# Patient Record
Sex: Male | Born: 1990 | Race: Black or African American | Hispanic: No | Marital: Single | Smoking: Never smoker
Health system: Southern US, Community
[De-identification: ages and names within clinical notes are randomized; demographics above are authoritative.]

## PROBLEM LIST (undated history)

## (undated) DIAGNOSIS — J302 Other seasonal allergic rhinitis: Secondary | ICD-10-CM

---
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
## 2009-01-20 ENCOUNTER — Emergency Department (HOSPITAL_COMMUNITY): Admission: EM | Admit: 2009-01-20 | Discharge: 2009-01-20 | Payer: Self-pay | Admitting: Emergency Medicine

## 2009-06-04 ENCOUNTER — Emergency Department (HOSPITAL_COMMUNITY): Admission: EM | Admit: 2009-06-04 | Discharge: 2009-06-04 | Payer: Self-pay | Admitting: Emergency Medicine

## 2009-06-07 ENCOUNTER — Emergency Department (HOSPITAL_COMMUNITY): Admission: EM | Admit: 2009-06-07 | Discharge: 2009-06-07 | Payer: Self-pay | Admitting: Emergency Medicine

## 2010-10-16 ENCOUNTER — Emergency Department (HOSPITAL_COMMUNITY)
Admission: EM | Admit: 2010-10-16 | Discharge: 2010-10-16 | Disposition: A | Discharge status: Home / Self Care | Payer: Private Health Insurance - Indemnity | Attending: Emergency Medicine | Admitting: Emergency Medicine

## 2010-10-16 DIAGNOSIS — IMO0002 Reserved for concepts with insufficient information to code with codable children: Secondary | ICD-10-CM | POA: Insufficient documentation
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
## 2012-05-29 ENCOUNTER — Encounter (HOSPITAL_COMMUNITY): Payer: Self-pay | Admitting: Emergency Medicine

## 2012-05-29 ENCOUNTER — Emergency Department (HOSPITAL_COMMUNITY)
Admission: EM | Admit: 2012-05-29 | Discharge: 2012-05-29 | Disposition: A | Discharge status: Home / Self Care | Payer: Private Health Insurance - Indemnity | Attending: Emergency Medicine | Admitting: Emergency Medicine

## 2012-05-29 DIAGNOSIS — K0889 Other specified disorders of teeth and supporting structures: Secondary | ICD-10-CM

## 2012-05-29 DIAGNOSIS — F17200 Nicotine dependence, unspecified, uncomplicated: Secondary | ICD-10-CM | POA: Insufficient documentation

## 2012-05-29 DIAGNOSIS — K089 Disorder of teeth and supporting structures, unspecified: Secondary | ICD-10-CM | POA: Insufficient documentation

## 2012-05-29 MED ORDER — NAPROXEN 250 MG PO TABS
500.0000 mg | ORAL_TABLET | Freq: Once | ORAL | Status: AC
  Administered 2012-05-29: 500 mg via ORAL
  Filled 2012-05-29: qty 2
  Filled 2012-05-29: qty 1

## 2012-05-29 MED ORDER — NAPROXEN 500 MG PO TABS: 500.0000 mg | ORAL_TABLET | Freq: Two times a day (BID) | ORAL | Status: DC

## 2012-05-29 NOTE — ED Notes (Signed)
Patient complaining of dental pain since yesterday. States he took a Tylenol PM at 0100 today with no relief.

## 2012-05-29 NOTE — ED Provider Notes (Signed)
History     CSN: [id number]  Arrival date & time 05/29/12  8295   First MD Initiated Contact with Patient 05/29/12 0400      Chief Complaint  Patient presents with  . Dental Pain    (Consider location/radiation/quality/duration/timing/severity/associated sxs/prior treatment) HPI  Brandon Ayala is a 21 y.o. male who presents to the Emergency Department complaining of pain to both wisdom teeth on the bottom right and left that has been present since yesterday.. Pain is with chewing. He has bitten the inside of his cheek multiple times in the past with his wisdom teeth. He has been seen by a dentist in the past who advised him to have the teeth extracted. He has taken no medicine.   History reviewed. No pertinent past medical history.  History reviewed. No pertinent past surgical history.  History reviewed. No pertinent family history.  History  Substance Use Topics  . Smoking status: Current Every Day Smoker  . Smokeless tobacco: Not on file  . Alcohol Use: No      Review of Systems  Constitutional: Negative for fever.       10 Systems reviewed and are negative for acute change except as noted in the HPI.  HENT: Positive for dental problem. Negative for congestion.   Eyes: Negative for discharge and redness.  Respiratory: Negative for cough and shortness of breath.   Cardiovascular: Negative for chest pain.  Gastrointestinal: Negative for vomiting and abdominal pain.  Musculoskeletal: Negative for back pain.  Skin: Negative for rash.  Neurological: Negative for syncope, numbness and headaches.  Psychiatric/Behavioral:       No behavior change.    Allergies  Review of patient's allergies indicates no known allergies.  Home Medications  No current outpatient prescriptions on file.  BP 154/79  Pulse 64  Temp 98.4 F (36.9 C) (Oral)  Resp 16  Ht 5\' 11"  (1.803 m)  Wt 225 lb (102.059 kg)  BMI 31.38 kg/m2  SpO2 99%  Physical Exam  Nursing note and  vitals reviewed. Constitutional:       Awake, alert, nontoxic appearance.  HENT:  Head: Normocephalic and atraumatic.  Mouth/Throat: Oropharynx is clear and moist.       Dentition in good repair. Molars without evidence of caries or abscess. Gums are not swollen. No buccal injuries noted.  Eyes: Right eye exhibits no discharge. Left eye exhibits no discharge.  Neck: Neck supple.  Cardiovascular: Normal heart sounds.   Pulmonary/Chest: Effort normal and breath sounds normal. He exhibits no tenderness.  Abdominal: Soft. There is no tenderness. There is no rebound.  Musculoskeletal: He exhibits no tenderness.       Baseline ROM, no obvious new focal weakness.  Neurological:       Mental status and motor strength appears baseline for patient and situation.  Skin: No rash noted.  Psychiatric: He has a normal mood and affect.    ED Course  Procedures (including critical care time)     MDM  Patient with dental pain. Given naprosyn. Rx for same. Encouraged to follow up with a dentist.Pt stable in ED with no significant deterioration in condition.The patient appears reasonably screened and/or stabilized for discharge and I doubt any other medical condition or other [facility] requiring further screening, evaluation, or treatment in the ED at this time prior to discharge.  MDM Reviewed: nursing note and vitals           Nicoletta Dress. Colon Branch, MD 05/29/12 [phone]

## 2012-06-26 ENCOUNTER — Encounter (HOSPITAL_COMMUNITY): Payer: Self-pay

## 2012-06-26 ENCOUNTER — Emergency Department (HOSPITAL_COMMUNITY)
Admission: EM | Admit: 2012-06-26 | Discharge: 2012-06-27 | Disposition: A | Discharge status: Home / Self Care | Payer: Private Health Insurance - Indemnity | Attending: Emergency Medicine | Admitting: Emergency Medicine

## 2012-06-26 DIAGNOSIS — F17200 Nicotine dependence, unspecified, uncomplicated: Secondary | ICD-10-CM | POA: Insufficient documentation

## 2012-06-26 DIAGNOSIS — K047 Periapical abscess without sinus: Secondary | ICD-10-CM | POA: Insufficient documentation

## 2012-06-26 MED ORDER — AMOXICILLIN 250 MG PO CAPS
500.0000 mg | ORAL_CAPSULE | Freq: Once | ORAL | Status: AC
  Administered 2012-06-26: 500 mg via ORAL
  Filled 2012-06-26: qty 2

## 2012-06-26 MED ORDER — HYDROCODONE-ACETAMINOPHEN 7.5-500 MG/15ML PO SOLN: 15.0000 mL | Freq: Four times a day (QID) | ORAL | Status: DC | PRN

## 2012-06-26 MED ORDER — OXYCODONE-ACETAMINOPHEN 5-325 MG PO TABS
2.0000 | ORAL_TABLET | Freq: Once | ORAL | Status: AC
  Administered 2012-06-26: 2 via ORAL
  Filled 2012-06-26: qty 2

## 2012-06-26 MED ORDER — IBUPROFEN 800 MG PO TABS: 800.0000 mg | ORAL_TABLET | Freq: Three times a day (TID) | ORAL | Status: DC

## 2012-06-26 MED ORDER — AMOXICILLIN 500 MG PO CAPS: 500.0000 mg | ORAL_CAPSULE | Freq: Three times a day (TID) | ORAL | Status: DC

## 2012-06-26 NOTE — ED Provider Notes (Signed)
History    This chart was scribed for Brandon Nielsen, MD, MD by Smitty Pluck, ED Scribe. The patient was seen in room APA05 and the patient's care was started at 11:18PM.   CSN: [id number]  Arrival date & time 06/26/12  2220      Chief Complaint  Patient presents with  . Dental Pain    (Consider location/radiation/quality/duration/timing/severity/associated sxs/prior treatment) Patient is a 21 y.o. male presenting with tooth pain. The history is provided by the patient. No language interpreter was used.  Dental PainThe symptoms began 2 days ago. The symptoms are worsening. The symptoms are recurrent. The symptoms occur constantly.  Additional symptoms do not include: trouble swallowing, pain with swallowing and ear pain.   Brandon Ayala is a 21 y.o. male who presents to the Emergency Department complaining of constant, severe sharp left lower molar dental pain onset 2 days ago. Pt reports that he has taken naproxyn and advil without relief. Denies fevers, vomiting, trouble breathing, trouble swallowing and any other pain. Denies any other medical complication.   History reviewed. No pertinent past medical history.  History reviewed. No pertinent past surgical history.  No family history on file.  History  Substance Use Topics  . Smoking status: Current Every Day Smoker  . Smokeless tobacco: Not on file  . Alcohol Use: No      Review of Systems  HENT: Negative for ear pain and trouble swallowing.   All other systems reviewed and are negative.    Allergies  Review of patient's allergies indicates no known allergies.  Home Medications   Current Outpatient Rx  Name  Route  Sig  Dispense  Refill  . HYDROCODONE-ACETAMINOPHEN 5-325 MG PO TABS   Oral   Take 1 tablet by mouth once as needed. For pain         . NAPROXEN 500 MG PO TABS   Oral   Take 1 tablet (500 mg total) by mouth 2 (two) times daily.   30 tablet   0     BP 173/89  Pulse 70  Temp 98.3 F  (36.8 C) (Oral)  Resp 20  Ht 5\' 11"  (1.803 m)  Wt 225 lb (102.059 kg)  BMI 31.38 kg/m2  SpO2 96%  Physical Exam  Nursing note and vitals reviewed. Constitutional: He is oriented to person, place, and time. He appears well-developed and well-nourished. No distress.  HENT:  Head: Normocephalic and atraumatic.  Right Ear: External ear normal.  Left Ear: External ear normal.       tender to palpation lower first molar No gingival swelling or fluctuance  No trismus Uvula midline   Eyes: Conjunctivae normal are normal.  Neck: Normal range of motion. Neck supple. No tracheal deviation present.  Cardiovascular: Normal rate.   Pulmonary/Chest: Effort normal. No respiratory distress.  Abdominal: Soft. He exhibits no distension.  Musculoskeletal: Normal range of motion.  Lymphadenopathy:    He has no cervical adenopathy.  Neurological: He is alert and oriented to person, place, and time.  Skin: Skin is warm and dry.  Psychiatric: He has a normal mood and affect. His behavior is normal.    ED Course  Dental Date/Time: 06/26/2012 11:49 PM Performed by: Brandon Ayala Authorized by: Brandon Ayala Consent: Verbal consent obtained. Risks and benefits: risks, benefits and alternatives were discussed Consent given by: patient Patient understanding: patient states understanding of the procedure being performed Patient consent: the patient's understanding of the procedure matches consent given Procedure consent: procedure consent matches procedure  scheduled Required items: required blood products, implants, devices, and special equipment available Patient identity confirmed: verbally with patient Time out: Immediately prior to procedure a "time out" was called to verify the correct patient, procedure, equipment, support staff and site/side marked as required. Preparation: Patient was prepped and draped in the usual sterile fashion. Local anesthesia used: yes Local anesthetic: bupivacaine  0.5% without epinephrine Anesthetic total: 1.8 ml Patient tolerance: Patient tolerated the procedure well with no immediate complications. Comments: 27 G needle used to locally inject L lower first molar with adequate anesthesia achieved   (including critical care time) DIAGNOSTIC STUDIES: Oxygen Saturation is 96% on room air, adequate by my interpretation.    COORDINATION OF CARE: 11:20 PM Discussed ED treatment with pt  11:47 PM Ordered:    . amoxicillin  500 mg Oral Once  . oxyCODONE-acetaminophen  2 tablet Oral Once       MDM  Dental pain. Dental block. Medications provided. Plan d/c home, ABx, pain meds and DDS referral provided for f/u  VS and nursing notes reviewed.   I personally performed the services described in this documentation, which was scribed in my presence. The recorded information has been reviewed and is accurate.        Brandon Nielsen, MD 06/26/12 2351

## 2012-06-26 NOTE — ED Notes (Signed)
Pt c/o dental pain for 2 months that has gotten worse over the past 2 days. States he is unable to find a Education officer, community.

## 2012-06-26 NOTE — ED Notes (Signed)
Pt c/o left lower wisdom tooth pain x 2 days without relief. Pt denies having a dentist at this time. Minimal swelling noted to left jaw, and gum line. NAD noted at this time.

## 2015-01-16 ENCOUNTER — Encounter (HOSPITAL_COMMUNITY): Payer: Self-pay | Admitting: Emergency Medicine

## 2015-01-16 ENCOUNTER — Emergency Department (HOSPITAL_COMMUNITY)
Admission: EM | Admit: 2015-01-16 | Discharge: 2015-01-16 | Disposition: A | Discharge status: Home / Self Care | Payer: Private Health Insurance - Indemnity | Attending: Emergency Medicine | Admitting: Emergency Medicine

## 2015-01-16 DIAGNOSIS — R1084 Generalized abdominal pain: Secondary | ICD-10-CM

## 2015-01-16 DIAGNOSIS — R1111 Vomiting without nausea: Secondary | ICD-10-CM

## 2015-01-16 DIAGNOSIS — R42 Dizziness and giddiness: Secondary | ICD-10-CM | POA: Insufficient documentation

## 2015-01-16 DIAGNOSIS — R6883 Chills (without fever): Secondary | ICD-10-CM | POA: Insufficient documentation

## 2015-01-16 DIAGNOSIS — R112 Nausea with vomiting, unspecified: Secondary | ICD-10-CM | POA: Insufficient documentation

## 2015-01-16 DIAGNOSIS — Z720 Tobacco use: Secondary | ICD-10-CM | POA: Insufficient documentation

## 2015-01-16 LAB — URINALYSIS, ROUTINE W REFLEX MICROSCOPIC
BILIRUBIN URINE: NEGATIVE
Glucose, UA: NEGATIVE mg/dL
KETONES UR: NEGATIVE mg/dL
Leukocytes, UA: NEGATIVE
NITRITE: NEGATIVE
Protein, ur: NEGATIVE mg/dL
Specific Gravity, Urine: 1.02 (ref 1.005–1.030)
Urobilinogen, UA: 0.2 mg/dL (ref 0.0–1.0)
pH: 6.5 (ref 5.0–8.0)

## 2015-01-16 LAB — COMPREHENSIVE METABOLIC PANEL
ALT: 14 U/L — ABNORMAL LOW (ref 17–63)
AST: 15 U/L (ref 15–41)
Albumin: 4.8 g/dL (ref 3.5–5.0)
Alkaline Phosphatase: 61 U/L (ref 38–126)
Anion gap: 8 (ref 5–15)
BUN: 12 mg/dL (ref 6–20)
CALCIUM: 9 mg/dL (ref 8.9–10.3)
CO2: 26 mmol/L (ref 22–32)
Chloride: 105 mmol/L (ref 101–111)
Creatinine, Ser: 0.95 mg/dL (ref 0.61–1.24)
GFR calc Af Amer: >60 mL/min (ref >60)
GFR calc non Af Amer: >60 mL/min (ref >60)
Glucose, Bld: 102 mg/dL — ABNORMAL HIGH (ref 65–99)
Potassium: 4 mmol/L (ref 3.5–5.1)
SODIUM: 139 mmol/L (ref 135–145)
TOTAL PROTEIN: 8.3 g/dL — AB (ref 6.5–8.1)
Total Bilirubin: 0.5 mg/dL (ref 0.3–1.2)

## 2015-01-16 LAB — CBC WITH DIFFERENTIAL/PLATELET
BASOS ABS: 0 10*3/uL (ref 0.0–0.1)
Basophils Relative: 0 % (ref 0–1)
EOS ABS: 0 10*3/uL (ref 0.0–0.7)
Eosinophils Relative: 0 % (ref 0–5)
HEMATOCRIT: 45.9 % (ref 39.0–52.0)
HEMOGLOBIN: 15 g/dL (ref 13.0–17.0)
LYMPHS ABS: 1.2 10*3/uL (ref 0.7–4.0)
Lymphocytes Relative: 8 % — ABNORMAL LOW (ref 12–46)
MCH: 28.4 pg (ref 26.0–34.0)
MCHC: 32.7 g/dL (ref 30.0–36.0)
MCV: 86.8 fL (ref 78.0–100.0)
MONOS PCT: 10 % (ref 3–12)
Monocytes Absolute: 1.4 10*3/uL — ABNORMAL HIGH (ref 0.1–1.0)
Neutro Abs: 11.7 10*3/uL — ABNORMAL HIGH (ref 1.7–7.7)
Neutrophils Relative %: 82 % — ABNORMAL HIGH (ref 43–77)
PLATELETS: 272 10*3/uL (ref 150–400)
RBC: 5.29 MIL/uL (ref 4.22–5.81)
RDW: 14.6 % (ref 11.5–15.5)
WBC: 14.3 10*3/uL — AB (ref 4.0–10.5)

## 2015-01-16 LAB — URINE MICROSCOPIC-ADD ON

## 2015-01-16 LAB — LIPASE, BLOOD: Lipase: 15 U/L — ABNORMAL LOW (ref 22–51)

## 2015-01-16 MED ORDER — RANITIDINE HCL 150 MG PO TABS: 150.0000 mg | ORAL_TABLET | Freq: Two times a day (BID) | ORAL | Status: AC

## 2015-01-16 MED ORDER — FAMOTIDINE IN NACL 20-0.9 MG/50ML-% IV SOLN
20.0000 mg | INTRAVENOUS | Status: AC
  Administered 2015-01-16: 20 mg via INTRAVENOUS
  Filled 2015-01-16: qty 50

## 2015-01-16 NOTE — Discharge Instructions (Signed)
[facility] Doctor List ° ° ° °Edward Hawkins MD. Specialty: Pulmonary Disease Contact information: [address]  °[address]  °[place] [address]  °[phone]  ° °Margaret Simpson, MD. Specialty: Family Medicine Contact information: [address]  °[place] [address]  °[phone]  ° °Scott Luking, MD. Specialty: Family Medicine Contact information: [address]  °[address]  °[place] [address]  °[phone]  ° °Tesfaye Fanta, MD Specialty: Internal Medicine Contact information: [address]  °[place] [address]  °[phone]  ° °Zach Hall, MD. Specialty: Internal Medicine Contact information: [address]  °[place] [address]  °[phone]  ° °Angus Mcinnis, MD. Specialty: Family Medicine Contact information: [address]  °[place] [address]  °[phone]  ° °Stephen Knowlton, MD. Specialty: Family Medicine Contact information: [address]  °[address]  °[place] [address]  °[phone]  ° °Roy Fagan, MD. Specialty: Internal Medicine Contact information: [address]  °[address]  °[place] [address]  °[phone]  ° ° °

## 2015-01-16 NOTE — ED Notes (Signed)
Patient c/o generalized abd pain with nausea and vomiting. Denies any diarrhea or fevers. Per patient started this morning-vomited x3. Patient states that he took medication "for upset stomach" but unsure of name. No active vomiting noted at this time.

## 2015-01-16 NOTE — ED Provider Notes (Signed)
CSN: [id number]     Arrival date & time 01/16/15  1056 History  This chart was scribed for Eber Hong, MD by Tanda Rockers, ED Scribe. This patient was seen in room APA09/APA09 and the patient's care was started at 11:24 AM.  Chief Complaint  Patient presents with  . Abdominal Pain   The history is provided by the patient. No language interpreter was used.     HPI Comments: Brandon Ayala is a 24 y.o. male who presents to the Emergency Department complaining of generalized abdominal pain, nausea, and vomiting that began this morning around 9:30 AM (2 hours ago). Pt felt fine upon waking up but reports belching and having a sulfur taste in his mouth. Pt states that he went to work and was lifting boxes when his mouth filled with saliva. He states that he knew he was going to vomit and went to the bathroom to do so. Pt has vomited 3 times this morning. He describes the vomit as brown in color. Pt also got chilly prior to vomiting and states he became dizzy afterwards. Pt began having abdominal pain after vomiting but is currently pain free. He mentions that he has had this issue in the past approximately 3-4 months ago. This is the 3rd time it has happened since. Pt reports that sometimes he wakes up and belches, which he states smells like "old eggs." When this happens he reports he knows he will vomit that day. The vomiting always occurs in the morning. Pt has not seen a physician for these symptoms in the past. He denies eating anything or doing any activity that is out of the norm for him. Pt did not eat this morning but mentions eating pork chops and swiss rolls last night. Denies fever, diarrhea, constipation, headache, chest pain, shortness of breath, hematochezia, hematemesis, dysuria, hematuria, leg swelling, cough, shortness of breath, or any other symptoms. No recent foreign travel or past abdominal surgeries.  Pt denies EtOH or cigarette smoker. He mentions that he used to be an  occasional EtOH drinker but does not do so anymore. Pt reports smoking marijuana approximately 2 times per week.   History reviewed. No pertinent past medical history. History reviewed. No pertinent past surgical history. History reviewed. No pertinent family history. History  Substance Use Topics  . Smoking status: Current Every Day Smoker  . Smokeless tobacco: Not on file  . Alcohol Use: No    Review of Systems  Constitutional: Positive for chills. Negative for fever.  Respiratory: Negative for cough and shortness of breath.   Cardiovascular: Negative for chest pain and leg swelling.  Gastrointestinal: Positive for nausea, vomiting and abdominal pain. Negative for diarrhea and blood in stool.  Genitourinary: Negative for dysuria and hematuria.  Neurological: Positive for dizziness. Negative for headaches.  All other systems reviewed and are negative.  Allergies  Review of patient's allergies indicates no known allergies.  Home Medications   Prior to Admission medications   Medication Sig Start Date End Date Taking? Authorizing Provider  ranitidine (ZANTAC) 150 MG tablet Take 1 tablet (150 mg total) by mouth 2 (two) times daily. 01/16/15   Eber Hong, MD   Triage Vitals: BP 143/66 mmHg  Pulse 62  Temp(Src) 98.7 F (37.1 C) (Oral)  Resp 18  Ht 6' (1.829 m)  Wt 220 lb (99.791 kg)  BMI 29.83 kg/m2  SpO2 100%   Physical Exam  Constitutional: He appears well-developed and well-nourished. No distress.  HENT:  Head: Normocephalic and atraumatic.  Mouth/Throat: Oropharynx is clear and moist. No oropharyngeal exudate.  Eyes: Conjunctivae and EOM are normal. Pupils are equal, round, and reactive to light. Right eye exhibits no discharge. Left eye exhibits no discharge. No scleral icterus.  Neck: Normal range of motion. Neck supple. No JVD present. No thyromegaly present.  Cardiovascular: Normal rate, regular rhythm, normal heart sounds and intact distal pulses.  Exam reveals no  gallop and no friction rub.   No murmur heard. Pulmonary/Chest: Effort normal and breath sounds normal. No respiratory distress. He has no wheezes. He has no rales.  Abdominal: Soft. Bowel sounds are normal. He exhibits no distension and no mass. There is no tenderness.  Musculoskeletal: Normal range of motion. He exhibits no edema or tenderness.  Lymphadenopathy:    He has no cervical adenopathy.  Neurological: He is alert. Coordination normal.  Skin: Skin is warm and dry. No rash noted. No erythema.  Psychiatric: He has a normal mood and affect. His behavior is normal.  Nursing note and vitals reviewed.   ED Course  Procedures (including critical care time)  DIAGNOSTIC STUDIES: Oxygen Saturation is 100% on RA, normal by my interpretation.    COORDINATION OF CARE: 11:34 AM-Discussed treatment plan with pt at bedside and pt agreed to plan.   Labs Review Labs Reviewed  URINALYSIS, ROUTINE W REFLEX MICROSCOPIC (NOT AT [facility]) - Abnormal; Notable for the following:    Hgb urine dipstick LARGE (*)    All other components within normal limits  CBC WITH DIFFERENTIAL/PLATELET - Abnormal; Notable for the following:    WBC 14.3 (*)    Neutrophils Relative % 82 (*)    Neutro Abs 11.7 (*)    Lymphocytes Relative 8 (*)    Monocytes Absolute 1.4 (*)    All other components within normal limits  COMPREHENSIVE METABOLIC PANEL - Abnormal; Notable for the following:    Glucose, Bld 102 (*)    Total Protein 8.3 (*)    ALT 14 (*)    All other components within normal limits  LIPASE, BLOOD - Abnormal; Notable for the following:    Lipase 15 (*)    All other components within normal limits  URINE MICROSCOPIC-ADD ON - Abnormal; Notable for the following:    Bacteria, UA FEW (*)    All other components within normal limits    Imaging Review No results found.    MDM   Final diagnoses:  Non-intractable vomiting without nausea, vomiting of unspecified type  Generalized abdominal pain     The patient has normal lab work, he does have a leukocytosis but this is nonspecific and in the absence of any abdominal tenderness does not mandate further workup at this time. We'll start Zantac to be taken twice daily and then to transition to once daily after 10 days, patient is in agreement, follow-up list given for local family doctors. Patient expresses understanding and appears stable for discharge.  Meds given in ED:  Medications  famotidine (PEPCID) IVPB 20 mg premix (20 mg Intravenous New Bag/Given 01/16/15 1204)    New Prescriptions   RANITIDINE (ZANTAC) 150 MG TABLET    Take 1 tablet (150 mg total) by mouth 2 (two) times daily.     I personally performed the services described in this documentation, which was scribed in my presence. The recorded information has been reviewed and is accurate.      Eber Hong, MD 01/16/15 [phone]

## 2015-05-09 ENCOUNTER — Encounter (HOSPITAL_COMMUNITY): Payer: Self-pay | Admitting: Emergency Medicine

## 2015-05-09 ENCOUNTER — Emergency Department (HOSPITAL_COMMUNITY)
Admission: EM | Admit: 2015-05-09 | Discharge: 2015-05-09 | Disposition: A | Discharge status: Home / Self Care | Payer: Private Health Insurance - Indemnity | Attending: Emergency Medicine | Admitting: Emergency Medicine

## 2015-05-09 DIAGNOSIS — K529 Noninfective gastroenteritis and colitis, unspecified: Secondary | ICD-10-CM | POA: Insufficient documentation

## 2015-05-09 DIAGNOSIS — R6883 Chills (without fever): Secondary | ICD-10-CM | POA: Insufficient documentation

## 2015-05-09 HISTORY — DX: Other seasonal allergic rhinitis: J30.2

## 2015-05-09 LAB — URINALYSIS, ROUTINE W REFLEX MICROSCOPIC
Bilirubin Urine: NEGATIVE
Glucose, UA: NEGATIVE mg/dL
Ketones, ur: NEGATIVE mg/dL
LEUKOCYTES UA: NEGATIVE
Nitrite: NEGATIVE
PROTEIN: NEGATIVE mg/dL
UROBILINOGEN UA: 0.2 mg/dL (ref 0.0–1.0)
pH: 5.5 (ref 5.0–8.0)

## 2015-05-09 LAB — URINE MICROSCOPIC-ADD ON

## 2015-05-09 MED ORDER — ONDANSETRON 8 MG PO TBDP
8.0000 mg | ORAL_TABLET | Freq: Once | ORAL | Status: AC
  Administered 2015-05-09: 8 mg via ORAL
  Filled 2015-05-09: qty 1

## 2015-05-09 MED ORDER — ONDANSETRON 8 MG PO TBDP: 8.0000 mg | ORAL_TABLET | Freq: Three times a day (TID) | ORAL | Status: AC | PRN

## 2015-05-09 NOTE — ED Provider Notes (Signed)
CSN: [id number]     Arrival date & time 05/09/15  1308 History   First MD Initiated Contact with Patient 05/09/15 0800     Chief Complaint  Patient presents with  . Emesis     (Consider location/radiation/quality/duration/timing/severity/associated sxs/prior Treatment) The history is provided by the patient.   Brandon Ayala is a 24 y.o. male with no significant past medical history presenting with nausea vomiting and diarrhea.  He reports having diarrhea only starting 2 days ago, reporting several episodes of watery, non bloody stools which better so was able to work yesterday.  On his way to work this morning he had an episode of vomiting followed by diarrhea once again.  He denies abdominal pain except for cramping prior diarrheal episodes.  He denies weakness , dizziness or fevers.  He does endorse chills but when he took his temperature it was normal.  He has had no medicines prior to arrival.  He did take a cipro tablet yesterday given by his mother (who has chronic colitis) and had a left over tablet.    Past Medical History  Diagnosis Date  . Seasonal allergies    History reviewed. No pertinent past surgical history. History reviewed. No pertinent family history. Social History  Substance Use Topics  . Smoking status: Never Smoker   . Smokeless tobacco: Never Used  . Alcohol Use: No    Review of Systems  Constitutional: Positive for chills. Negative for fever.  HENT: Negative for congestion and sore throat.   Eyes: Negative.   Respiratory: Negative for chest tightness and shortness of breath.   Cardiovascular: Negative for chest pain.  Gastrointestinal: Positive for nausea and vomiting. Negative for abdominal pain and blood in stool.  Genitourinary: Negative.   Musculoskeletal: Negative for joint swelling, arthralgias and neck pain.  Skin: Negative.  Negative for rash and wound.  Neurological: Negative for dizziness, weakness, light-headedness, numbness and  headaches.  Psychiatric/Behavioral: Negative.       Allergies  Review of patient's allergies indicates no known allergies.  Home Medications   Prior to Admission medications   Medication Sig Start Date End Date Taking? Authorizing Provider  ondansetron (ZOFRAN ODT) 8 MG disintegrating tablet Take 1 tablet (8 mg total) by mouth every 8 (eight) hours as needed for nausea or vomiting. 05/09/15   Burgess Amor, PA-C  ranitidine (ZANTAC) 150 MG tablet Take 1 tablet (150 mg total) by mouth 2 (two) times daily. Patient not taking: Reported on 05/09/2015 01/16/15   Eber Hong, MD   BP 154/82 mmHg  Pulse 60  Temp(Src) 97.8 F (36.6 C) (Oral)  Resp 18  Ht 6' (1.829 m)  Wt 218 lb (98.884 kg)  BMI 29.56 kg/m2  SpO2 100% Physical Exam  Constitutional: He appears well-developed and well-nourished.  HENT:  Head: Normocephalic and atraumatic.  Mouth/Throat: Oropharynx is clear and moist.  Eyes: Conjunctivae are normal.  Neck: Normal range of motion.  Cardiovascular: Normal rate, regular rhythm, normal heart sounds and intact distal pulses.   Pulmonary/Chest: Effort normal and breath sounds normal. He has no wheezes.  Abdominal: Soft. Bowel sounds are normal. He exhibits no distension and no mass. There is no tenderness. There is no rebound and no guarding.  Musculoskeletal: Normal range of motion.  Neurological: He is alert.  Skin: Skin is warm and dry.  Psychiatric: He has a normal mood and affect.  Nursing note and vitals reviewed.   ED Course  Procedures (including critical care time) Labs Review Labs Reviewed  URINALYSIS,  ROUTINE W REFLEX MICROSCOPIC (NOT AT [facility]) - Abnormal; Notable for the following:    Specific Gravity, [facility] <1.005 (*)    Hgb urine dipstick SMALL (*)    All other components within normal limits  URINE MICROSCOPIC-ADD ON    Imaging Review No results found. I have personally reviewed and evaluated these images and lab results as part of my medical  decision-making.   EKG Interpretation None      MDM   Final diagnoses:  Gastroenteritis    Patients  labs reviewed.  No dehydration per UA.  Pt re-examined prior to dc home, abdomen soft, nontender.  Suspect patient has viral GI illness, mild.  No acute findings to suggest need for imaging or further labs.  He was given zofran which eliminated his nausea and tolerated PO fluids.  Given prescription for same, fluids, rest, BRAT diet.  Strict return instructions for development of pain or more frequent diarrhea or uncontrolled vomiting.  The patient appears reasonably screened and/or stabilized for discharge and I doubt any other medical condition or other [facility] requiring further screening, evaluation, or treatment in the ED at this time prior to discharge.    Burgess AmorJulie Clearance Chenault, PA-C 05/09/15 1634  Benjiman CoreNathan Pickering, MD 05/14/15 [phone]

## 2015-05-09 NOTE — ED Notes (Signed)
Per patient vomited once this morning and has had some diarrhea. Denies any fevers or abd pain. Patient states "This started a couple of days ago but I thought it was going away until I vomited again this morning." Denies any nausea at this time.

## 2015-05-09 NOTE — Discharge Instructions (Signed)
I recommend applying the B.r.a.t. Diet which can help relieve diarrhea and stomach upset (bananas, plain rice, applesauce, toast).  Also make sure you are drinking plenty of fluids.

## 2017-06-16 ENCOUNTER — Encounter (HOSPITAL_COMMUNITY): Payer: Self-pay | Admitting: Cardiology

## 2017-06-16 ENCOUNTER — Emergency Department (HOSPITAL_COMMUNITY)
Admission: EM | Admit: 2017-06-16 | Discharge: 2017-06-16 | Disposition: A | Discharge status: Home / Self Care | Payer: Private Health Insurance - Indemnity | Attending: Emergency Medicine | Admitting: Emergency Medicine

## 2017-06-16 DIAGNOSIS — L02412 Cutaneous abscess of left axilla: Secondary | ICD-10-CM

## 2017-06-16 MED ORDER — SULFAMETHOXAZOLE-TRIMETHOPRIM 800-160 MG PO TABS: 2.0000 | ORAL_TABLET | Freq: Two times a day (BID) | ORAL | 0 refills | Status: AC

## 2017-06-16 MED ORDER — HYDROCODONE-ACETAMINOPHEN 5-325 MG PO TABS: 1.0000 | ORAL_TABLET | ORAL | 0 refills | Status: AC | PRN

## 2017-06-16 MED ORDER — IBUPROFEN 600 MG PO TABS: 600.0000 mg | ORAL_TABLET | Freq: Four times a day (QID) | ORAL | 0 refills | Status: AC | PRN

## 2017-06-16 MED ORDER — HYDROMORPHONE HCL 1 MG/ML IJ SOLN
1.0000 mg | Freq: Once | INTRAMUSCULAR | Status: AC
  Administered 2017-06-16: 1 mg via INTRAMUSCULAR
  Filled 2017-06-16: qty 1

## 2017-06-16 MED ORDER — LIDOCAINE HCL (PF) 1 % IJ SOLN
5.0000 mL | Freq: Once | INTRAMUSCULAR | Status: AC
  Administered 2017-06-16: 5 mL
  Filled 2017-06-16: qty 6

## 2017-06-16 MED ORDER — POVIDONE-IODINE 10 % EX SOLN
CUTANEOUS | Status: AC
Start: 2017-06-16 — End: 2017-06-16
  Administered 2017-06-16: 1
  Filled 2017-06-16: qty 15

## 2017-06-16 MED ORDER — ONDANSETRON 4 MG PO TBDP
4.0000 mg | ORAL_TABLET | Freq: Once | ORAL | Status: AC
  Administered 2017-06-16: 4 mg via ORAL
  Filled 2017-06-16: qty 1

## 2017-06-16 NOTE — ED Provider Notes (Signed)
[facility] Provider Note   CSN: [id number] Arrival date & time: 06/16/17  1034     History   Chief Complaint Chief Complaint  Patient presents with  . Recurrent Skin Infections    HPI Brandon Ayala is a 26 y.o. male with a history of occasional axillary abscesses presenting with a 6 day history of a painful boil under his left axilla.  His pain is constant, worse with palpation and pressure.  He has used warm compresses and he states that area of pain has decreased in size, but he has a more localized swelling which is quite painful but has not drained.  He has found no other alleviators for his symptoms.  The history is provided by the patient.    Past Medical History:  Diagnosis Date  . Seasonal allergies     There are no active problems to display for this patient.   History reviewed. No pertinent surgical history.     Home Medications    Prior to Admission medications   Medication Sig Start Date End Date Taking? Authorizing Provider  HYDROcodone-acetaminophen (NORCO/VICODIN) 5-325 MG tablet Take 1 tablet by mouth every 4 (four) hours as needed. 06/16/17   Burgess AmorIdol, Rickita Forstner, PA-C  ibuprofen (ADVIL,MOTRIN) 600 MG tablet Take 1 tablet (600 mg total) by mouth every 6 (six) hours as needed. 06/16/17   Burgess AmorIdol, Rudy Luhmann, PA-C  ondansetron (ZOFRAN ODT) 8 MG disintegrating tablet Take 1 tablet (8 mg total) by mouth every 8 (eight) hours as needed for nausea or vomiting. 05/09/15   Burgess AmorIdol, Drayden Lukas, PA-C  ranitidine (ZANTAC) 150 MG tablet Take 1 tablet (150 mg total) by mouth 2 (two) times daily. Patient not taking: Reported on 05/09/2015 01/16/15   Eber HongMiller, Brian, MD  sulfamethoxazole-trimethoprim (BACTRIM DS,SEPTRA DS) 800-160 MG tablet Take 2 tablets by mouth 2 (two) times daily for 7 days. 06/16/17 06/23/17  Burgess AmorIdol, Drea Jurewicz, PA-C    Family History History reviewed. No pertinent family history.  Social History Social History   Tobacco Use  . Smoking status:  Never Smoker  . Smokeless tobacco: Never Used  Substance Use Topics  . Alcohol use: No  . Drug use: No     Allergies   Patient has no known allergies.   Review of Systems Review of Systems  Constitutional: Negative for chills and fever.  Respiratory: Negative for shortness of breath and wheezing.   Skin: Positive for wound.  Neurological: Negative for numbness.     Physical Exam Updated Vital Signs BP (!) 146/70   Pulse 73   Temp 99.5 F (37.5 C) (Oral)   Resp 16   Ht 5\' 11"  (1.803 m)   Wt 99.8 kg (220 lb)   SpO2 100%   BMI 30.68 kg/m   Physical Exam  Constitutional: He is oriented to person, place, and time. He appears well-developed and well-nourished.  HENT:  Head: Normocephalic.  Cardiovascular: Normal rate.  Pulmonary/Chest: Effort normal.  Musculoskeletal: Normal range of motion.  Neurological: He is alert and oriented to person, place, and time. No sensory deficit.  Skin:  Raised and fluctuant abscess left mid axilla.  There is no red streaking, no surrounding erythema.  Area is tender.  No spontaneous drainage or pointing.     ED Treatments / Results  Labs (all labs ordered are listed, but only abnormal results are displayed) Labs Reviewed - No data to display  EKG  EKG Interpretation None       Radiology No results found.  Procedures Procedures (  including critical care time)  INCISION AND DRAINAGE Performed by: Burgess AmorIDOL, Mateja Dier Consent: Verbal consent obtained. Risks and benefits: risks, benefits and alternatives were discussed Type: abscess  Body area: Left axilla  Anesthesia: local infiltration  Incision was made with a scalpel.  Local anesthetic: lidocaine 1 % without epinephrine  Anesthetic total: 5 ml after lidocaine injected, patient still had significant pain at the site.  He was given Dilaudid 1 mg IM prior to procedure.  Complexity: complex Blunt dissection to break up loculations  Drainage: purulent  Drainage amount:  moderate Packing material: none  Patient tolerance: Patient tolerated the procedure well with no immediate complications.     Medications Ordered in ED Medications  lidocaine (PF) (XYLOCAINE) 1 % injection 5 mL (5 mLs Other Given 06/16/17 1145)  povidone-iodine (BETADINE) 10 % external solution (1 application  Given 06/16/17 1145)  HYDROmorphone (DILAUDID) injection 1 mg (1 mg Intramuscular Given 06/16/17 1259)  ondansetron (ZOFRAN-ODT) disintegrating tablet 4 mg (4 mg Oral Given 06/16/17 1258)     Initial Impression / Assessment and Plan / ED Course  I have reviewed the triage vital signs and the nursing notes.  Pertinent labs & imaging results that were available during my care of the patient were reviewed by me and considered in my medical decision making (see chart for details).     Abscess instructions given. Bactrim, hydrocodone, ibuprofen, warm soaks.  Prn f/u anticipated.  Final Clinical Impressions(s) / ED Diagnoses   Final diagnoses:  Abscess of axilla, left    ED Discharge Orders        Ordered    sulfamethoxazole-trimethoprim (BACTRIM DS,SEPTRA DS) 800-160 MG tablet  2 times daily     06/16/17 1418    HYDROcodone-acetaminophen (NORCO/VICODIN) 5-325 MG tablet  Every 4 hours PRN     06/16/17 1418    ibuprofen (ADVIL,MOTRIN) 600 MG tablet  Every 6 hours PRN     06/16/17 1420       Burgess Amordol, Pietro Bonura, PA-C 06/16/17 1456    Gerhard MunchLockwood, Robert, MD 06/16/17 1458

## 2017-06-16 NOTE — Discharge Instructions (Signed)
Keep your wound clean and covered except when flushing the site (stand in the shower for 5 minutes several times daily and allow the water to massage this area as discussed).  Take the entire course of the antibiotics.  You may take the hydrocodone prescribed for pain relief.  This will make you drowsy - do not drive within 4 hours of taking this medication. You may also use ibuprofen for pain which has also been prescribed.

## 2017-06-16 NOTE — ED Triage Notes (Signed)
Boil under left arm since Monday.

## 2017-08-24 DEATH — deceased
